# Patient Record
Sex: Male | Born: 1965 | Race: White | Hispanic: No | Marital: Single | Smoking: Never smoker
Health system: Southern US, Community
[De-identification: ages and names within clinical notes are randomized; demographics above are authoritative.]

## PROBLEM LIST (undated history)

## (undated) DIAGNOSIS — F419 Anxiety disorder, unspecified: Secondary | ICD-10-CM

## (undated) DIAGNOSIS — E7800 Pure hypercholesterolemia, unspecified: Secondary | ICD-10-CM

## (undated) DIAGNOSIS — E079 Disorder of thyroid, unspecified: Secondary | ICD-10-CM

---

## 2003-05-31 ENCOUNTER — Emergency Department (HOSPITAL_COMMUNITY): Admission: EM | Admit: 2003-05-31 | Discharge: 2003-05-31 | Payer: Self-pay | Admitting: Emergency Medicine

## 2020-04-02 ENCOUNTER — Encounter (HOSPITAL_COMMUNITY): Payer: Self-pay | Admitting: Emergency Medicine

## 2020-04-02 ENCOUNTER — Emergency Department (HOSPITAL_COMMUNITY)
Admission: EM | Admit: 2020-04-02 | Discharge: 2020-04-03 | Disposition: A | Discharge status: Home / Self Care | Payer: BC Managed Care – PPO | Attending: Emergency Medicine | Admitting: Emergency Medicine

## 2020-04-02 ENCOUNTER — Emergency Department (HOSPITAL_COMMUNITY): Payer: BC Managed Care – PPO

## 2020-04-02 ENCOUNTER — Other Ambulatory Visit: Payer: Self-pay

## 2020-04-02 DIAGNOSIS — R42 Dizziness and giddiness: Secondary | ICD-10-CM

## 2020-04-02 DIAGNOSIS — I6502 Occlusion and stenosis of left vertebral artery: Secondary | ICD-10-CM

## 2020-04-02 HISTORY — DX: Pure hypercholesterolemia, unspecified: E78.00

## 2020-04-02 HISTORY — DX: Disorder of thyroid, unspecified: E07.9

## 2020-04-02 HISTORY — DX: Anxiety disorder, unspecified: F41.9

## 2020-04-02 LAB — CBC
HCT: 50.7 % (ref 39.0–52.0)
Hemoglobin: 16.3 g/dL (ref 13.0–17.0)
MCH: 31.3 pg (ref 26.0–34.0)
MCHC: 32.1 g/dL (ref 30.0–36.0)
MCV: 97.3 fL (ref 80.0–100.0)
Platelets: 192 10*3/uL (ref 150–400)
RBC: 5.21 MIL/uL (ref 4.22–5.81)
RDW: 12.3 % (ref 11.5–15.5)
WBC: 14.2 10*3/uL — ABNORMAL HIGH (ref 4.0–10.5)
nRBC: 0 % (ref 0.0–0.2)

## 2020-04-02 LAB — DIFFERENTIAL
Abs Immature Granulocytes: 0.09 10*3/uL — ABNORMAL HIGH (ref 0.00–0.07)
Basophils Absolute: 0 10*3/uL (ref 0.0–0.1)
Basophils Relative: 0 %
Eosinophils Absolute: 0 10*3/uL (ref 0.0–0.5)
Eosinophils Relative: 0 %
Immature Granulocytes: 1 %
Lymphocytes Relative: 8 %
Lymphs Abs: 1.2 10*3/uL (ref 0.7–4.0)
Monocytes Absolute: 0.7 10*3/uL (ref 0.1–1.0)
Monocytes Relative: 5 %
Neutro Abs: 12.2 10*3/uL — ABNORMAL HIGH (ref 1.7–7.7)
Neutrophils Relative %: 86 %

## 2020-04-02 LAB — COMPREHENSIVE METABOLIC PANEL
ALT: 37 U/L (ref 0–44)
AST: 29 U/L (ref 15–41)
Albumin: 4.1 g/dL (ref 3.5–5.0)
Alkaline Phosphatase: 46 U/L (ref 38–126)
Anion gap: 11 (ref 5–15)
BUN: 14 mg/dL (ref 6–20)
CO2: 25 mmol/L (ref 22–32)
Calcium: 9.2 mg/dL (ref 8.9–10.3)
Chloride: 104 mmol/L (ref 98–111)
Creatinine, Ser: 0.82 mg/dL (ref 0.61–1.24)
GFR, Estimated: >60 mL/min (ref >60)
Glucose, Bld: 134 mg/dL — ABNORMAL HIGH (ref 70–99)
Potassium: 3.7 mmol/L (ref 3.5–5.1)
Sodium: 140 mmol/L (ref 135–145)
Total Bilirubin: 1.6 mg/dL — ABNORMAL HIGH (ref 0.3–1.2)
Total Protein: 7 g/dL (ref 6.5–8.1)

## 2020-04-02 LAB — PROTIME-INR
INR: 1 (ref 0.8–1.2)
Prothrombin Time: 12.4 seconds (ref 11.4–15.2)

## 2020-04-02 LAB — APTT: aPTT: 23 seconds — ABNORMAL LOW (ref 24–36)

## 2020-04-02 MED ORDER — IOHEXOL 350 MG/ML SOLN
75.0000 mL | Freq: Once | INTRAVENOUS | Status: AC | PRN
  Administered 2020-04-02: 75 mL via INTRAVENOUS

## 2020-04-02 MED ORDER — LORAZEPAM 2 MG/ML IJ SOLN
0.5000 mg | INTRAMUSCULAR | Status: DC | PRN
  Administered 2020-04-02: 0.5 mg via INTRAVENOUS
  Filled 2020-04-02: qty 1

## 2020-04-02 MED ORDER — SODIUM CHLORIDE 0.9% FLUSH
3.0000 mL | Freq: Once | INTRAVENOUS | Status: AC
  Administered 2020-04-02: 3 mL via INTRAVENOUS

## 2020-04-02 NOTE — ED Triage Notes (Addendum)
Pt to triage via [facility] from home.  States he has been having neck pain x 4-5 days.  Laid down and put ice pack on neck.  Reports sudden onset of room spinning and vomiting since noon.  20g IV R hand, NS 500cc and Zofran 4mg  IV given PTA by EMS. No arm drift.  No focal neuro deficits noted.

## 2020-04-02 NOTE — ED Provider Notes (Cosign Needed Addendum)
[facility] EMERGENCY DEPARTMENT Provider Note   CSN: [id number] Arrival date & time: 04/02/20  1426     History Chief Complaint  Patient presents with  . Dizziness    Peter Bates is a 55 y.o. male.  55 year old male with history of hyperlipidemia (on medication, seeing integrative medicine who diagnosed thyroid disease- given medication, also testosterone injections- starting 3 weeks ago), presents with EMS for dizziness. Reports right and left paraspinous pain into trap area for the past 4-5 days, felt like maybe from weight lifting, no known specific injury. Patient woke up around 7AM today, had his usual day of breakfast and walking the dogs. Laid down on the sofa with an ice pack on his neck, within a few minutes felt dizzy- room spinning. Patient tried to get to the bathroom to throw up, had to crawl on the floor. Dizziness was worse with standing. Unable to get up due to dizziness, was laying on the floor when wife came home and found him to be diaphoretic, called 911.  Denies chest pain, shortness of breath, unilateral weakness or numbness, changes in speech or vision.  At this time feels light headed. Turning head does not reproduce symptoms.  Yesterday while sitting had an episode described as sensation of standing too soon/head rush type feeling.         Past Medical History:  Diagnosis Date  . Anxiety   . High cholesterol   . Thyroid disease     There are no problems to display for this patient.   History reviewed. No pertinent surgical history.     No family history on file.  Social History   Tobacco Use  . Smoking status: Never Smoker  . Smokeless tobacco: Never Used  Substance Use Topics  . Alcohol use: Not Currently  . Drug use: Yes    Types: Marijuana    Home Medications Prior to Admission medications   Not on File    Allergies    Patient has no allergy information on record.  Review of Systems   Review of Systems   Constitutional: Negative for fever.  Eyes: Negative for visual disturbance.  Respiratory: Negative for shortness of breath.   Cardiovascular: Negative for chest pain.  Gastrointestinal: Positive for nausea and vomiting. Negative for abdominal pain.  Musculoskeletal: Positive for myalgias and neck pain.  Skin: Negative for rash and wound.  Allergic/Immunologic: Negative for immunocompromised state.  Neurological: Positive for dizziness and light-headedness. Negative for facial asymmetry, speech difficulty, weakness, numbness and headaches.  Psychiatric/Behavioral: Negative for confusion.  All other systems reviewed and are negative.   Physical Exam Updated Vital Signs BP 106/76   Pulse 72   Temp 97.9 F (36.6 C)   Resp 16   SpO2 96%   Physical Exam Vitals and nursing note reviewed.  Constitutional:      General: He is not in acute distress.    Appearance: He is well-developed and well-nourished. He is not diaphoretic.  HENT:     Head: Normocephalic and atraumatic.     Mouth/Throat:     Mouth: Mucous membranes are moist.  Eyes:     Extraocular Movements: Extraocular movements intact.     Pupils: Pupils are equal, round, and reactive to light.  Cardiovascular:     Rate and Rhythm: Normal rate and regular rhythm.     Pulses: Normal pulses.     Heart sounds: Normal heart sounds.  Pulmonary:     Effort: Pulmonary effort is  normal.     Breath sounds: Normal breath sounds.  Abdominal:     Palpations: Abdomen is soft.     Tenderness: There is no abdominal tenderness.  Musculoskeletal:     Cervical back: Neck supple. Tenderness present. No bony tenderness.       Back:     Right lower leg: No edema.     Left lower leg: No edema.  Skin:    General: Skin is warm and dry.     Findings: No erythema or rash.  Neurological:     Mental Status: He is alert and oriented to person, place, and time.     Cranial Nerves: No cranial nerve deficit.     Sensory: No sensory deficit.      Motor: No weakness.     Coordination: Coordination normal.     Gait: Gait normal.  Psychiatric:        Mood and Affect: Mood and affect normal.        Behavior: Behavior normal.     ED Results / Procedures / Treatments   Labs (all labs ordered are listed, but only abnormal results are displayed) Labs Reviewed  APTT - Abnormal; Notable for the following components:      Result Value   aPTT 23 (*)    All other components within normal limits  CBC - Abnormal; Notable for the following components:   WBC 14.2 (*)    All other components within normal limits  DIFFERENTIAL - Abnormal; Notable for the following components:   Neutro Abs 12.2 (*)    Abs Immature Granulocytes 0.09 (*)    All other components within normal limits  COMPREHENSIVE METABOLIC PANEL - Abnormal; Notable for the following components:   Glucose, Bld 134 (*)    Total Bilirubin 1.6 (*)    All other components within normal limits  PROTIME-INR    EKG EKG Interpretation  Date/Time:  Sunday April 02 2020 14:33:42 EST Ventricular Rate:  83 PR Interval:  146 QRS Duration: 96 QT Interval:  388 QTC Calculation: 455 R Axis:   64 Text Interpretation: Normal sinus rhythm T wave abnormality, consider inferior ischemia Abnormal ECG No old tracing to compare Confirmed by Melene Plan [phone]) on 04/02/2020 4:19:15 PM   Radiology CT Angio Head W/Cm &/Or Wo Cm  Result Date: 04/02/2020 CLINICAL DATA:  Dizziness EXAM: CT ANGIOGRAPHY HEAD AND NECK TECHNIQUE: Multidetector CT imaging of the head and neck was performed using the standard protocol during bolus administration of intravenous contrast. Multiplanar CT image reconstructions and MIPs were obtained to evaluate the vascular anatomy. Carotid stenosis measurements (when applicable) are obtained utilizing NASCET criteria, using the distal internal carotid diameter as the denominator. CONTRAST:  35mL OMNIPAQUE IOHEXOL 350 MG/ML SOLN COMPARISON:  None. FINDINGS: CTA NECK Aortic  arch: Great vessel origins are patent. Right carotid system: Patent. No stenosis. Mild tortuosity of the ICA. Left carotid system: Patent.  No stenosis. Vertebral arteries: Patent. Left vertebral artery dominant. There is noncalcified plaque at the left vertebral origin with marked stenosis. No apparent distal flow limitation. No evidence of dissection. Skeleton: Degenerative changes of the cervical spine. Other neck: Unremarkable. Upper chest: Included upper lungs are clear. Review of the MIP images confirms the above findings CTA HEAD Anterior circulation: Intracranial internal carotid arteries are patent. Anterior cerebral arteries are patent. Anterior communicating artery is present. Middle cerebral arteries are patent. Posterior circulation: Intracranial vertebral arteries, basilar artery, and posterior cerebral arteries are patent. Major cerebellar artery origins are patent.  Posterior communicating arteries are present bilaterally. Venous sinuses: Patent as allowed by contrast bolus timing. Review of the MIP images confirms the above findings IMPRESSION: No large vessel occlusion.  No evidence of dissection. No stenosis at the ICA origins. Marked stenosis at the left vertebral artery origin without apparent distal flow limitation. Electronically Signed   By: Guadlupe Spanish M.D.   On: 04/02/2020 20:15   CT HEAD WO CONTRAST  Result Date: 04/02/2020 CLINICAL DATA:  Dizziness. EXAM: CT HEAD WITHOUT CONTRAST TECHNIQUE: Contiguous axial images were obtained from the base of the skull through the vertex without intravenous contrast. COMPARISON:  None. FINDINGS: Brain: No evidence of acute infarction, hemorrhage, hydrocephalus, extra-axial collection or mass lesion/mass effect. Vascular: No hyperdense vessel or unexpected calcification. Skull: Normal. Negative for fracture or focal lesion. Sinuses/Orbits: No acute finding. Other: None. IMPRESSION: No acute intracranial pathology. Electronically Signed   By:  Aram Candela M.D.   On: 04/02/2020 15:12   CT Angio Neck W and/or Wo Contrast  Result Date: 04/02/2020 CLINICAL DATA:  Dizziness EXAM: CT ANGIOGRAPHY HEAD AND NECK TECHNIQUE: Multidetector CT imaging of the head and neck was performed using the standard protocol during bolus administration of intravenous contrast. Multiplanar CT image reconstructions and MIPs were obtained to evaluate the vascular anatomy. Carotid stenosis measurements (when applicable) are obtained utilizing NASCET criteria, using the distal internal carotid diameter as the denominator. CONTRAST:  3mL OMNIPAQUE IOHEXOL 350 MG/ML SOLN COMPARISON:  None. FINDINGS: CTA NECK Aortic arch: Great vessel origins are patent. Right carotid system: Patent. No stenosis. Mild tortuosity of the ICA. Left carotid system: Patent.  No stenosis. Vertebral arteries: Patent. Left vertebral artery dominant. There is noncalcified plaque at the left vertebral origin with marked stenosis. No apparent distal flow limitation. No evidence of dissection. Skeleton: Degenerative changes of the cervical spine. Other neck: Unremarkable. Upper chest: Included upper lungs are clear. Review of the MIP images confirms the above findings CTA HEAD Anterior circulation: Intracranial internal carotid arteries are patent. Anterior cerebral arteries are patent. Anterior communicating artery is present. Middle cerebral arteries are patent. Posterior circulation: Intracranial vertebral arteries, basilar artery, and posterior cerebral arteries are patent. Major cerebellar artery origins are patent. Posterior communicating arteries are present bilaterally. Venous sinuses: Patent as allowed by contrast bolus timing. Review of the MIP images confirms the above findings IMPRESSION: No large vessel occlusion.  No evidence of dissection. No stenosis at the ICA origins. Marked stenosis at the left vertebral artery origin without apparent distal flow limitation. Electronically Signed   By:  Guadlupe Spanish M.D.   On: 04/02/2020 20:15   MR BRAIN WO CONTRAST  Result Date: 04/02/2020 CLINICAL DATA:  Initial evaluation for acute dizziness. EXAM: MRI HEAD WITHOUT CONTRAST TECHNIQUE: Multiplanar, multiecho pulse sequences of the brain and surrounding structures were obtained without intravenous contrast. COMPARISON:  None. FINDINGS: Brain: Cerebral volume within normal limits for patient age. No focal parenchymal signal abnormality identified. No abnormal foci of restricted diffusion to suggest acute or subacute ischemia. Gray-white matter differentiation well maintained. No encephalomalacia to suggest chronic infarction. No foci of susceptibility artifact to suggest acute or chronic intracranial hemorrhage. No mass lesion, midline shift or mass effect. No hydrocephalus. No extra-axial fluid collection. Major dural sinuses are grossly patent. Pituitary gland and suprasellar region are normal. Midline structures intact and normal. Vascular: Major intracranial vascular flow voids well maintained and normal in appearance. Skull and upper cervical spine: Craniocervical junction normal. Visualized upper cervical spine within normal limits. Bone marrow signal intensity  normal. No scalp soft tissue abnormality. Sinuses/Orbits: Globes and orbital soft tissues within normal limits. Mild mucosal thickening noted about the maxillary sinuses and ethmoidal air cells. Paranasal sinuses are otherwise clear. No mastoid effusion. Inner ear structures grossly normal. Other: None. IMPRESSION: Normal brain MRI. No acute intracranial abnormality identified. Electronically Signed   By: Rise MuBenjamin  McClintock M.D.   On: 04/02/2020 23:51    Procedures Procedures   Medications Ordered in ED Medications  LORazepam (ATIVAN) injection 0.5 mg (0.5 mg Intravenous Given 04/02/20 2124)  sodium chloride flush (NS) 0.9 % injection 3 mL (3 mLs Intravenous Given 04/02/20 1732)  iohexol (OMNIPAQUE) 350 MG/ML injection 75 mL (75 mLs  Intravenous Contrast Given 04/02/20 1957)    ED Course  I have reviewed the triage vital signs and the nursing notes.  Pertinent labs & imaging results that were available during my care of the patient were reviewed by me and considered in my medical decision making (see chart for details).  Clinical Course as of 04/02/20 2355  Wynelle LinkSun Apr 02, 2020  [id number] 55 year old male with dizziness, vomiting, as above. On exam, no abnormal neurological findings.  Stroke order set initiated in triage including CT head which is unremarkable.  Labs with no significant findings to CMP, PT PTT, CBC with my leukocytosis of white count 14 2 with increased neutrophils, possibly secondary to vomiting today. Case discussed with Dr. Iver NestleBhagat on-call with neuro hospital service who recommends CTA head and neck, if no significant abnormal findings, recommends follow with MRI brain.  If MRI is normal, patient may be discharged for outpatient follow-up, consider benign positional vertigo otherwise if abnormal findings, consult neuro hospitalist service.  [LM]  2056 CTA head and neck with vertebral artery stenosis otherwise no acute findings.  Discussed results with patient and wife at bedside, will follow up with MRI brain. [LM]  2337 Care signed out pending MRI brain.  [LM]    Clinical Course User Index [LM] Alden HippMurphy, Laura A, PA-C   MDM Rules/Calculators/A&P                          Final Clinical Impression(s) / ED Diagnoses Final diagnoses:  Vertebral artery stenosis, left  Dizziness    Rx / DC Orders ED Discharge Orders    None       Jeannie FendMurphy, Laura A, PA-C 04/02/20 2338    Jeannie FendMurphy, Laura A, PA-C 04/02/20 2355    Melene PlanFloyd, Dan, DO 04/04/20 1450

## 2020-04-03 NOTE — ED Provider Notes (Signed)
12:01 AM Patient's MRI results are normal.  The patient has been reexamined and he reports that his symptoms have not recurred.  He has no dizziness at this time.  His vital signs have remained stable.  Did discuss findings of the patient's left vertebral artery stenosis with Dr. Thomasena Edis of neurology.  Reports no indication for further emergent evaluation in this regard.  Does advise the patient start taking a baby aspirin daily.  These recommendations have been verbalized to the patient which he acknowledges.  Patient expresses comfort with outpatient primary care follow-up.  He has been instructed to return for new or concerning symptoms.  Discharged in stable condition.   Antony Madura, PA-C 04/03/20 0003    Cheryll Cockayne, MD 04/03/20 Rich Fuchs

## 2020-04-03 NOTE — Discharge Instructions (Signed)
We recommend you take a baby aspirin daily.  Follow-up with your primary care doctor.  You may return for new or concerning symptoms.

## 2021-08-17 IMAGING — CT CT ANGIO NECK
1 of 8 series · 15 of 46 positions shown, 19 images · IV contrast (omnipaque)
Comparison: None.

CLINICAL DATA: Dizziness

EXAM:
CT ANGIOGRAPHY HEAD AND NECK
TECHNIQUE: Multidetector CT imaging of the head and neck was performed using
the standard protocol during bolus administration of intravenous
contrast. Multiplanar CT image reconstructions and MIPs were
obtained to evaluate the vascular anatomy. Carotid stenosis
measurements (when applicable) are obtained utilizing NASCET
criteria, using the distal internal carotid diameter as the
denominator.
CONTRAST:  75mL OMNIPAQUE IOHEXOL 350 MG/ML SOLN

[Series 12: thin · axial · 0.51mm/px · z∈[-343,-6]mm · 15 of 740 slices shown, 19 images]
[im 33/740  soft-tissue]
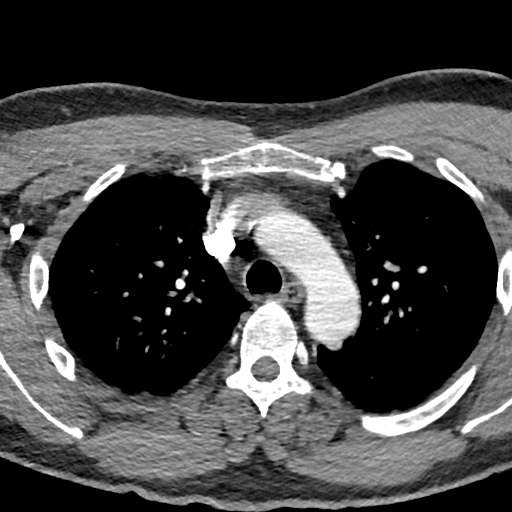
[im 33/740  bone]
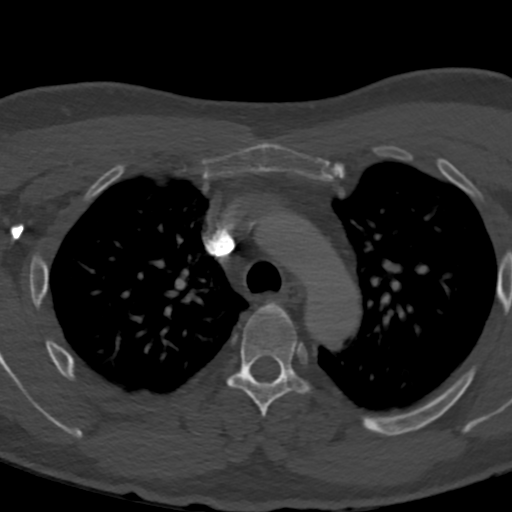
[im 97/740  soft-tissue]
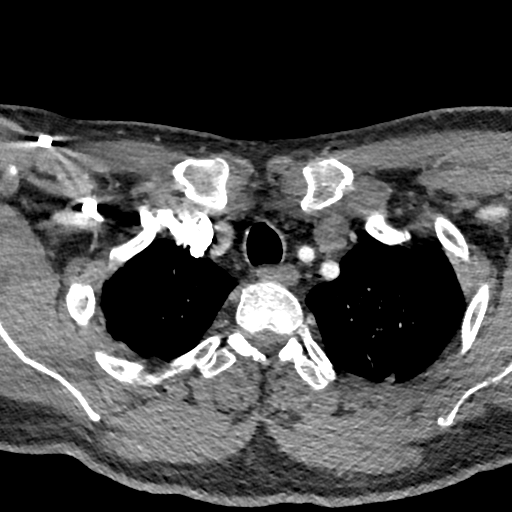
[im 161/740  soft-tissue]
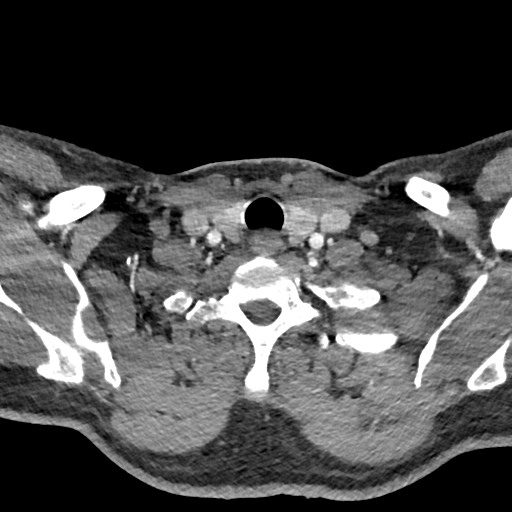
[im 193/740  soft-tissue]
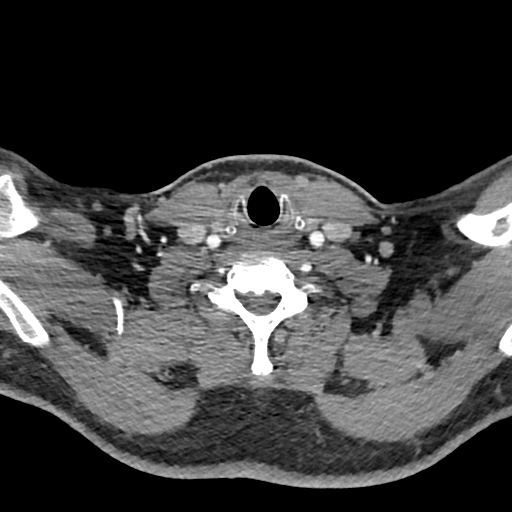
[im 258/740  soft-tissue]
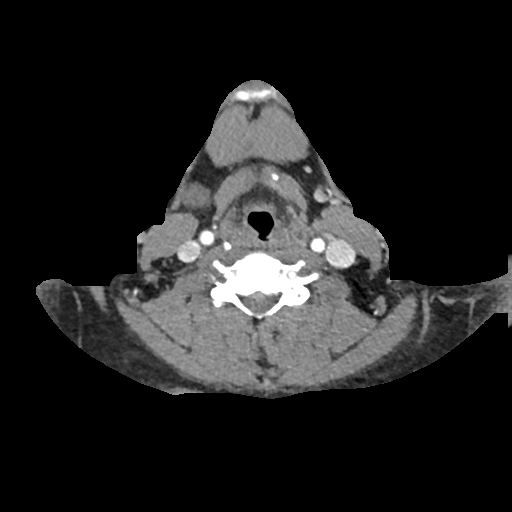
[im 322/740  soft-tissue]
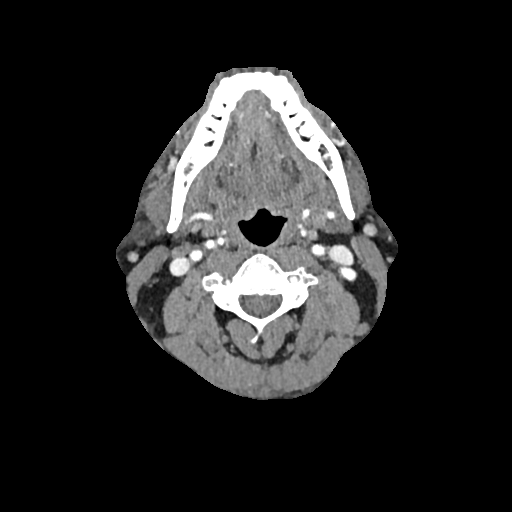
[im 386/740  soft-tissue]
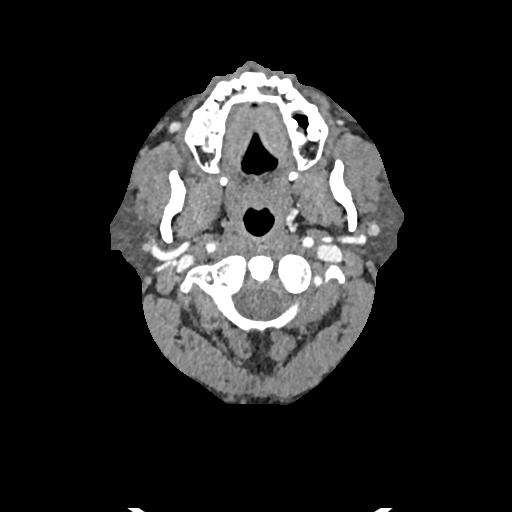
[im 418/740  soft-tissue]
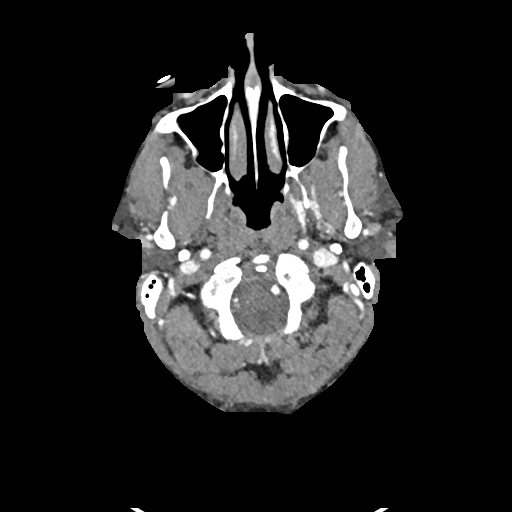
[im 482/740  soft-tissue]
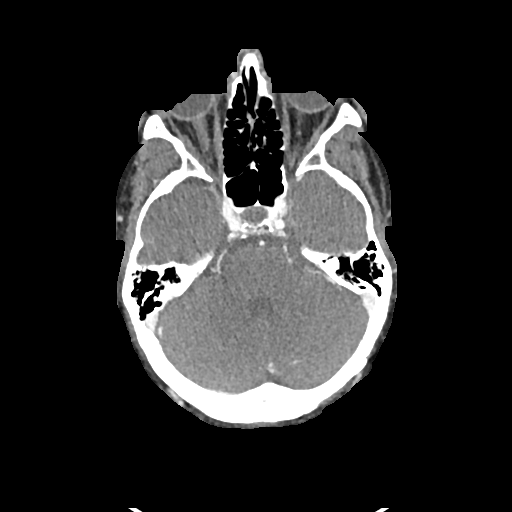
[im 482/740  bone]
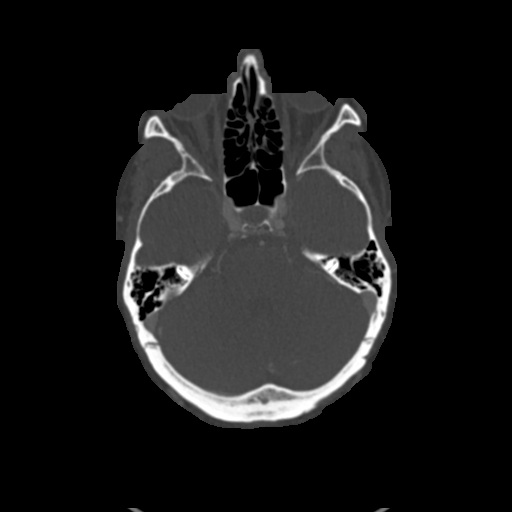
[im 547/740  soft-tissue]
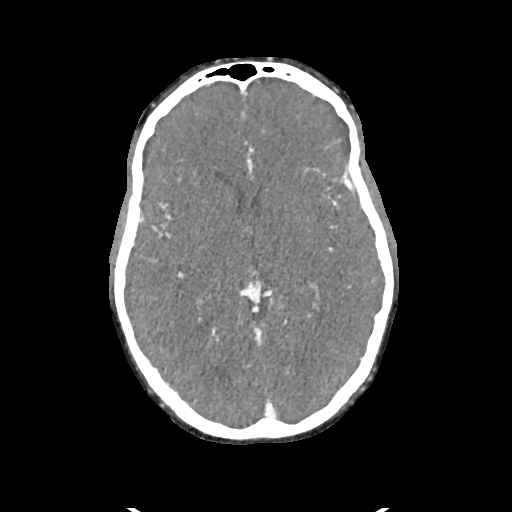
[im 579/740  soft-tissue]
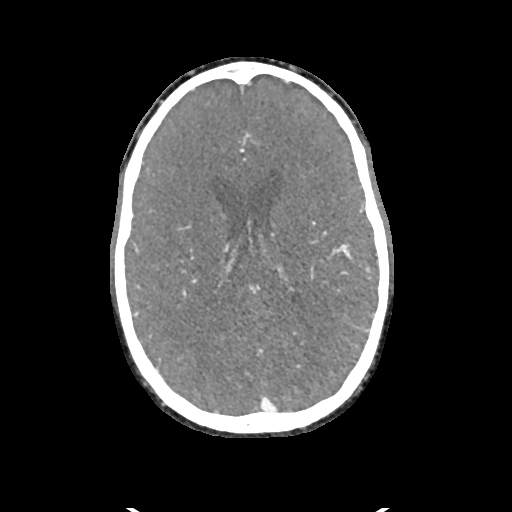
[im 611/740  lung]
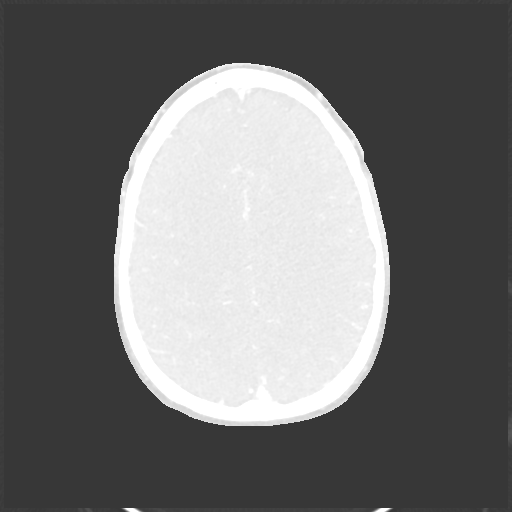
[im 643/740  soft-tissue]
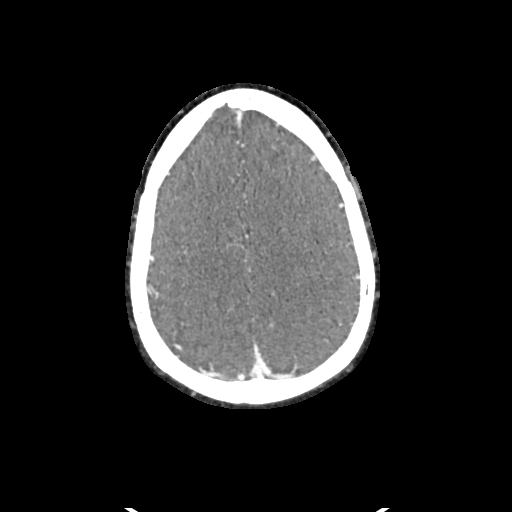
[im 643/740  lung]
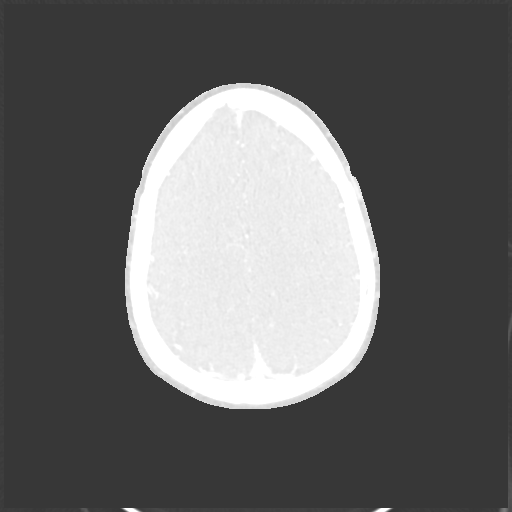
[im 675/740  lung]
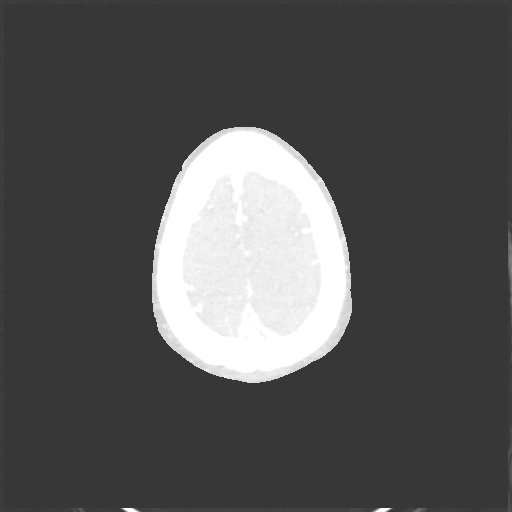
[im 707/740  soft-tissue]
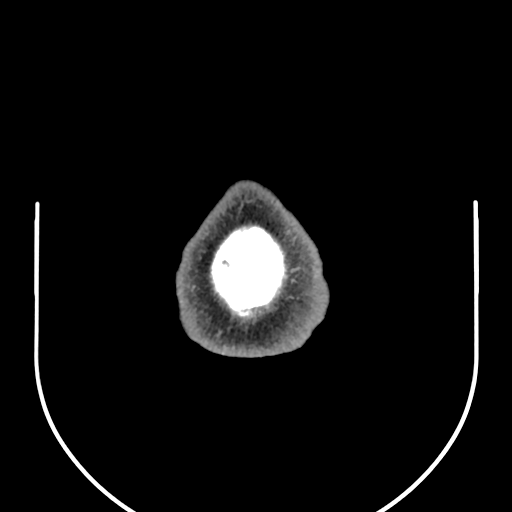
[im 707/740  lung]
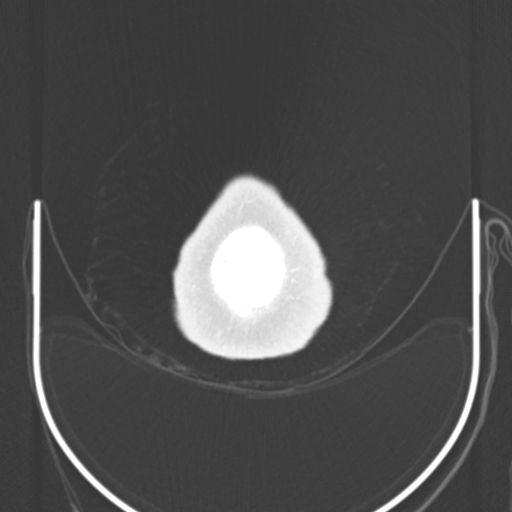

[15 of 46 positions shown; findings below may reference images not displayed]

FINDINGS: CTA NECK

Aortic arch: Great vessel origins are patent.

Right carotid system: Patent. No stenosis. Mild tortuosity of the
ICA.

Left carotid system: Patent.  No stenosis.

Vertebral arteries: Patent. Left vertebral artery dominant. There is
noncalcified plaque at the left vertebral origin with marked
stenosis. No apparent distal flow limitation. No evidence of
dissection.

Skeleton: Degenerative changes of the cervical spine.

Other neck: Unremarkable.

Upper chest: Included upper lungs are clear.

Review of the MIP images confirms the above findings

CTA HEAD

Anterior circulation: Intracranial internal carotid arteries are
patent. Anterior cerebral arteries are patent. Anterior
communicating artery is present. Middle cerebral arteries are
patent.

Posterior circulation: Intracranial vertebral arteries, basilar
artery, and posterior cerebral arteries are patent. Major cerebellar
artery origins are patent. Posterior communicating arteries are
present bilaterally.

Venous sinuses: Patent as allowed by contrast bolus timing.

Review of the MIP images confirms the above findings
IMPRESSION: No large vessel occlusion.  No evidence of dissection.

No stenosis at the ICA origins.

Marked stenosis at the left vertebral artery origin without apparent
distal flow limitation.
